# Patient Record
Sex: Female | Born: 1995
Health system: Southern US, Community
[De-identification: ages and names within clinical notes are randomized; demographics above are authoritative.]

## PROBLEM LIST (undated history)

## (undated) DIAGNOSIS — E162 Hypoglycemia, unspecified: Secondary | ICD-10-CM

---
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
## 2016-05-26 ENCOUNTER — Emergency Department (HOSPITAL_BASED_OUTPATIENT_CLINIC_OR_DEPARTMENT_OTHER)
Admission: EM | Admit: 2016-05-26 | Discharge: 2016-05-26 | Disposition: A | Discharge status: Home / Self Care | Payer: BLUE CROSS/BLUE SHIELD | Attending: Emergency Medicine | Admitting: Emergency Medicine

## 2016-05-26 ENCOUNTER — Encounter (HOSPITAL_BASED_OUTPATIENT_CLINIC_OR_DEPARTMENT_OTHER): Payer: Self-pay | Admitting: Emergency Medicine

## 2016-05-26 DIAGNOSIS — G43009 Migraine without aura, not intractable, without status migrainosus: Secondary | ICD-10-CM | POA: Diagnosis not present

## 2016-05-26 DIAGNOSIS — G43909 Migraine, unspecified, not intractable, without status migrainosus: Secondary | ICD-10-CM

## 2016-05-26 DIAGNOSIS — R51 Headache: Secondary | ICD-10-CM | POA: Diagnosis present

## 2016-05-26 MED ORDER — SODIUM CHLORIDE 0.9 % IV SOLN
INTRAVENOUS | Status: DC
  Administered 2016-05-26: 100 mL/h via INTRAVENOUS

## 2016-05-26 MED ORDER — DIPHENHYDRAMINE HCL 50 MG/ML IJ SOLN
25.0000 mg | Freq: Once | INTRAMUSCULAR | Status: AC
  Administered 2016-05-26: 25 mg via INTRAVENOUS
  Filled 2016-05-26: qty 1

## 2016-05-26 MED ORDER — PROMETHAZINE HCL 25 MG/ML IJ SOLN
12.5000 mg | Freq: Once | INTRAMUSCULAR | Status: AC
  Administered 2016-05-26: 12.5 mg via INTRAVENOUS
  Filled 2016-05-26: qty 1

## 2016-05-26 MED ORDER — DEXAMETHASONE SODIUM PHOSPHATE 10 MG/ML IJ SOLN
10.0000 mg | Freq: Once | INTRAMUSCULAR | Status: AC
  Administered 2016-05-26: 10 mg via INTRAVENOUS
  Filled 2016-05-26: qty 1

## 2016-05-26 MED ORDER — SODIUM CHLORIDE 0.9 % IV BOLUS (SEPSIS)
1000.0000 mL | Freq: Once | INTRAVENOUS | Status: AC
  Administered 2016-05-26: 1000 mL via INTRAVENOUS

## 2016-05-26 NOTE — ED Provider Notes (Signed)
[facility] Provider Note   CSN: [id number] Arrival date & time: 05/26/16  2041  By signing my name below, I, Majel Homer, attest that this documentation has been prepared under the direction and in the presence of Vanetta Mulders, MD . Electronically Signed: Majel Homer, Scribe. 05/26/2016. 10:21 PM.  History   Chief Complaint Chief Complaint  Patient presents with  . Headache   The history is provided by the patient. No language interpreter was used.   HPI Comments: Brittany Mccoy is a 20 y.o. female who presents to the Emergency Department for an evaluation of a gradually improving, left sided headache. Pt reports her headache began at ~12:00 PM this afternoon and intensified to a 10/10 headache at ~8:00 PM this evening. She notes her headache has now improved to a 7/10 pain in the ED. Pt reports her pain begins in her left temple and radiates to her left posterior head. She notes associated photophobia, "starry vision" and lightheadedness that has now resolved. She reports hx of headaches but notes they are usually not centralized to the left side of her head and resolve with medication. She denies nausea, vomiting and fever.   Past Medical History:  Diagnosis Date  . Hypoglycemia    There are no active problems to display for this patient.  History reviewed. No pertinent surgical history.  OB History    No data available     Home Medications    Prior to Admission medications   Medication Sig Start Date End Date Taking? Authorizing Provider  acetaminophen (TYLENOL) 500 MG tablet Take 1,000 mg by mouth every 6 (six) hours as needed. For pain     Historical Provider, MD  dexmethylphenidate (FOCALIN) 10 MG tablet Take 10 mg by mouth daily as needed. For ADHD    Historical Provider, MD  ibuprofen (ADVIL,MOTRIN) 600 MG tablet Take 1 tablet (600 mg total) by mouth every 6 (six) hours as needed. 08/08/13   Glynn Octave, MD  naproxen sodium (ANAPROX) 220 MG tablet Take  440 mg by mouth 2 (two) times daily with a meal.    Historical Provider, MD  sulfamethoxazole-trimethoprim (SEPTRA DS) 800-160 MG per tablet Take 1 tablet by mouth every 12 (twelve) hours. 04/09/12   Elson Areas, PA-C    Family History History reviewed. No pertinent family history.  Social History Social History  Substance Use Topics  . Smoking status: Never Smoker  . Smokeless tobacco: Never Used  . Alcohol use No   Allergies   Penicillins  Review of Systems Review of Systems  Constitutional: Negative for fever.  HENT: Negative for rhinorrhea and sore throat.   Eyes: Positive for photophobia and visual disturbance.  Respiratory: Negative for cough and shortness of breath.   Cardiovascular: Negative for chest pain and leg swelling.  Gastrointestinal: Negative for abdominal pain, diarrhea, nausea and vomiting.  Genitourinary: Negative for dysuria.  Musculoskeletal: Negative for back pain and neck pain.  Skin: Negative for rash.  Neurological: Positive for light-headedness and headaches.  Hematological: Does not bruise/bleed easily.   Physical Exam Updated Vital Signs BP 111/59 (BP Location: Right Arm)   Pulse 70   Temp 98 F (36.7 C)   Resp 17   Ht  (1.727 m)   Wt 143 lb 3.2 oz (65 kg)   LMP 05/11/2016   SpO2 100%   BMI 21.77 kg/m   Physical Exam  Constitutional: She is oriented to person, place, and time.  HENT:  Head: Normocephalic and atraumatic.  Mouth/Throat: Oropharynx is clear and moist.  Moist mucous membranes   Eyes: EOM are normal. Pupils are equal, round, and reactive to light. No scleral icterus.  Eyes are tracking normally  Neck: Normal range of motion. Neck supple.  Neck supple with normal ROM.  Cardiovascular: Normal rate, regular rhythm and normal heart sounds.   Pulmonary/Chest: Effort normal and breath sounds normal.  Lungs clear to auscultation bilaterally   Abdominal: Bowel sounds are normal. There is no tenderness.    Musculoskeletal: She exhibits no edema, tenderness or deformity.  No swelling in ankles   Neurological: She is alert and oriented to person, place, and time. Coordination normal.  Skin: No rash noted. No erythema. No pallor.   ED Treatments / Results  Labs (all labs ordered are listed, but only abnormal results are displayed) Labs Reviewed - No data to display  EKG  EKG Interpretation None       Radiology No results found.  Procedures Procedures (including critical care time)  Medications Ordered in ED Medications  0.9 %  sodium chloride infusion (100 mL/hr Intravenous New Bag/Given 05/26/16 2239)  sodium chloride 0.9 % bolus 1,000 mL (1,000 mLs Intravenous New Bag/Given 05/26/16 2238)  dexamethasone (DECADRON) injection 10 mg (10 mg Intravenous Given 05/26/16 2244)  promethazine (PHENERGAN) injection 12.5 mg (12.5 mg Intravenous Given 05/26/16 2244)  diphenhydrAMINE (BENADRYL) injection 25 mg (25 mg Intravenous Given 05/26/16 2244)    DIAGNOSTIC STUDIES:  Oxygen Saturation is 100% on RA, normal by my interpretation.    COORDINATION OF CARE:  10:21 PM Discussed treatment plan with pt at bedside and pt agreed to plan.  Initial Impression / Assessment and Plan / ED Course  I have reviewed the triage vital signs and the nursing notes.  Pertinent labs & imaging results that were available during my care of the patient were reviewed by me and considered in my medical decision making (see chart for details).  Clinical Course    Headache very suggestive of migraine. Left-sided headache. Progressively worse throughout the evening peaked at about 8 PM. And slowly started to improve. Associated with some visual changes. No family history of migraines. Patient's had headaches before but not like this. Not suggestive of subarachnoid hemorrhage. Patient was significant improvement with migraine cocktail Decadron Phenergan and Benadryl. As well as 1 L of IV fluids. Patient was  significant improvement headache is like a 1 out of 10 now. Patient we discharged home follow-up with her primary care Dr. work note provided.  Patient nontoxic no fevers. No concerns for meningitis neck is very supple.   I personally performed the services described in this documentation, which was scribed in my presence. The recorded information has been reviewed and is accurate.   Final Clinical Impressions(s) / ED Diagnoses   Final diagnoses:  Migraine without status migrainosus, not intractable, unspecified migraine type    New Prescriptions New Prescriptions   No medications on file     Vanetta MuldersScott Valor [place], MD 05/26/16 2325

## 2016-05-26 NOTE — ED Triage Notes (Addendum)
Patient reports that she had a Headache starting at about 12 oclock. Patient denies any N/V - reports that she did have some dizziness. Patient reports that she has the worst headache ever. Reports that she became real dizzy and then started to have the Headache. Has a History of Headaches but never this bad or this long. Denies any Injury

## 2016-05-26 NOTE — Discharge Instructions (Signed)
Work note provided to stay at home tomorrow. Symptoms very suggestive of migraine headache. Would expect resolution of the headache throughout tomorrow. Return for any new or worse symptoms. Make an appointment to follow-up with your doctor over the next couple weeks for reevaluation.

## 2016-07-23 ENCOUNTER — Emergency Department (HOSPITAL_BASED_OUTPATIENT_CLINIC_OR_DEPARTMENT_OTHER)
Admission: EM | Admit: 2016-07-23 | Discharge: 2016-07-23 | Disposition: A | Discharge status: Home / Self Care | Payer: BLUE CROSS/BLUE SHIELD | Attending: Emergency Medicine | Admitting: Emergency Medicine

## 2016-07-23 ENCOUNTER — Encounter (HOSPITAL_BASED_OUTPATIENT_CLINIC_OR_DEPARTMENT_OTHER): Payer: Self-pay | Admitting: Emergency Medicine

## 2016-07-23 ENCOUNTER — Emergency Department (HOSPITAL_BASED_OUTPATIENT_CLINIC_OR_DEPARTMENT_OTHER): Payer: BLUE CROSS/BLUE SHIELD

## 2016-07-23 DIAGNOSIS — R0789 Other chest pain: Secondary | ICD-10-CM | POA: Insufficient documentation

## 2016-07-23 DIAGNOSIS — R1110 Vomiting, unspecified: Secondary | ICD-10-CM | POA: Insufficient documentation

## 2016-07-23 DIAGNOSIS — J3489 Other specified disorders of nose and nasal sinuses: Secondary | ICD-10-CM | POA: Diagnosis not present

## 2016-07-23 DIAGNOSIS — R05 Cough: Secondary | ICD-10-CM | POA: Diagnosis present

## 2016-07-23 DIAGNOSIS — Z79899 Other long term (current) drug therapy: Secondary | ICD-10-CM | POA: Diagnosis not present

## 2016-07-23 DIAGNOSIS — R0982 Postnasal drip: Secondary | ICD-10-CM

## 2016-07-23 DIAGNOSIS — R059 Cough, unspecified: Secondary | ICD-10-CM

## 2016-07-23 MED ORDER — ALBUTEROL SULFATE HFA 108 (90 BASE) MCG/ACT IN AERS: 2.0000 | INHALATION_SPRAY | Freq: Four times a day (QID) | RESPIRATORY_TRACT | 0 refills | Status: AC | PRN

## 2016-07-23 MED ORDER — FLUTICASONE PROPIONATE 50 MCG/ACT NA SUSP: 2.0000 | Freq: Every day | NASAL | 0 refills | Status: AC

## 2016-07-23 NOTE — ED Provider Notes (Signed)
[facility]   CSN: [id number] Arrival date & time: 07/23/16  1112     History   Chief Complaint Chief Complaint  Patient presents with  . Cough    HPI Brittany Mccoy is a 21 y.o. female with PMH of seasonal allergies who presents with cough for 2.5 weeks.   HPI Patient with cough for 2.5 week which has worsened over the past week. No fevers or sore throat. Reports of post nasal drip. Reports of chest pain with cough. Reports of post-tussive emesis for the past few days but has been able to tolerate PO intake. Reports of symptoms worsening at night and patient has difficulty breathing at night. No sick contacts. Reports of two episodes of loose stools yesterday. No weight loss, hemoptysis. Normal PO intake   Past Medical History:  Diagnosis Date  . Hypoglycemia     There are no active problems to display for this patient.   History reviewed. No pertinent surgical history.  OB History    No data available       Home Medications    Prior to Admission medications   Medication Sig Start Date End Date Taking? Authorizing Provider  acetaminophen (TYLENOL) 500 MG tablet Take 1,000 mg by mouth every 6 (six) hours as needed. For pain     Historical Provider, MD  albuterol (PROVENTIL HFA;VENTOLIN HFA) 108 (90 Base) MCG/ACT inhaler Inhale 2 puffs into the lungs every 6 (six) hours as needed for wheezing or shortness of breath. 07/23/16   Palma Holter, MD  dexmethylphenidate (FOCALIN) 10 MG tablet Take 10 mg by mouth daily as needed. For ADHD    Historical Provider, MD  fluticasone (FLONASE) 50 MCG/ACT nasal spray Place 2 sprays into both nostrils daily. 07/23/16   Palma Holter, MD  ibuprofen (ADVIL,MOTRIN) 600 MG tablet Take 1 tablet (600 mg total) by mouth every 6 (six) hours as needed. 08/08/13   Glynn Octave, MD  naproxen sodium (ANAPROX) 220 MG tablet Take 440 mg by mouth 2 (two) times daily with a meal.    Historical Provider, MD    sulfamethoxazole-trimethoprim (SEPTRA DS) 800-160 MG per tablet Take 1 tablet by mouth every 12 (twelve) hours. 04/09/12   Elson Areas, PA-C    Family History No family history on file.  Social History Social History  Substance Use Topics  . Smoking status: Never Smoker  . Smokeless tobacco: Never Used  . Alcohol use Not on file     Allergies   Penicillins   Review of Systems Review of Systems  Constitutional: Negative for appetite change, chills and fever.  HENT: Positive for postnasal drip and rhinorrhea. Negative for sneezing and sore throat.   Respiratory: Positive for cough and chest tightness. Negative for shortness of breath.   Gastrointestinal: Negative for abdominal pain.     Physical Exam Updated Vital Signs BP 109/61 (BP Location: Right Arm)   Pulse 68   Temp 98.7 F (37.1 C) (Oral)   Resp 18   Ht 5\' 8"  (1.727 m)   Wt 61.2 kg   LMP 06/24/2016 Comment: BC patch  SpO2 100%   BMI 20.53 kg/m   Physical Exam  Constitutional: She is oriented to person, place, and time. She appears well-developed and well-nourished. No distress.  HENT:  Mouth/Throat: Oropharynx is clear and moist. No oropharyngeal exudate.  Eyes: Conjunctivae are normal.  Neck: Normal range of motion. Neck supple.  Cardiovascular: Normal rate, regular rhythm and normal heart sounds.  Exam  reveals no gallop and no friction rub.   No murmur heard. Pulmonary/Chest: Effort normal and breath sounds normal. No respiratory distress. She has no wheezes. She has no rales. She exhibits no tenderness.  Abdominal: Soft. Bowel sounds are normal. There is no tenderness.  Lymphadenopathy:    She has no cervical adenopathy.  Neurological: She is alert and oriented to person, place, and time.  Skin: Skin is warm and dry. Capillary refill takes less than 2 seconds.  Psychiatric: She has a normal mood and affect.     ED Treatments / Results  Labs (all labs ordered are listed, but only abnormal  results are displayed) Labs Reviewed - No data to display  EKG  EKG Interpretation None       Radiology Dg Chest 2 View  Result Date: 07/23/2016 CLINICAL DATA:  21 year-old female w/ productive cough since 07/05/16 EXAM: CHEST  2 VIEW COMPARISON:  None. FINDINGS: Midline trachea.  Normal heart size and mediastinal contours. Sharp costophrenic angles.  No pneumothorax.  Clear lungs. IMPRESSION: No active cardiopulmonary disease. Electronically Signed   By: Jeronimo GreavesKyle  Talbot M.D.   On: 07/23/2016 12:32    Procedures Procedures (including critical care time)  Medications Ordered in ED Medications - No data to display   Initial Impression / Assessment and Plan / ED Course  I have reviewed the triage vital signs and the nursing notes.  Pertinent labs & imaging results that were available during my care of the patient were reviewed by me and considered in my medical decision making (see chart for details).  Clinical Course    21yo female with PMH of seasonal allergies with cough for 2.5 weeks. Afebrile with stable vitals and normal oxygen saturation. CXR is unremarkable. Symptoms likely due to post nasal drip. Considered pertussis as well with post-tussive emesis but due to the duration of symptoms, medication would not be indicated for this. Provided Flonase and Albuterol PRN. Return precautions discussed.   Final Clinical Impressions(s) / ED Diagnoses   Final diagnoses:  Cough  Postnasal drip    New Prescriptions Discharge Medication List as of 07/23/2016  1:18 PM    START taking these medications   Details  albuterol (PROVENTIL HFA;VENTOLIN HFA) 108 (90 Base) MCG/ACT inhaler Inhale 2 puffs into the lungs every 6 (six) hours as needed for wheezing or shortness of breath., Starting Sat 07/23/2016, Print    fluticasone (FLONASE) 50 MCG/ACT nasal spray Place 2 sprays into both nostrils daily., Starting Sat 07/23/2016, Print         Palma HolterKanishka G Graiden Henes, MD 07/23/16 1504      Charlynne Panderavid Hsienta Yao, MD 07/23/16 [phone]

## 2016-07-23 NOTE — ED Triage Notes (Signed)
Has had a cough since 07/05/16, persistent cough, nasal drainage, coughing yellow mucus. Took dayquil PTA

## 2016-07-23 NOTE — ED Notes (Signed)
Pt's mother sts she is not "satisfied" because "I know she has bronchitis." RN offered to get the EDP so she could discuss concerns twice, but mother refused both times. Sts "I'll just take her to her doctor next week."

## 2016-07-23 NOTE — Discharge Instructions (Signed)
Your chest x-ray was normal. Your cough is most likely due to postnasal drip. Please take Flonase as prescribed. You can also try Albuterol as needed.

## 2018-07-29 IMAGING — DX DG CHEST 2V
2 series · 2 of 2 positions shown · non-contrast
Comparison: None.

CLINICAL DATA: 20 year-old female w/ productive cough since
07/05/16

EXAM:
CHEST  2 VIEW

[chest pa]
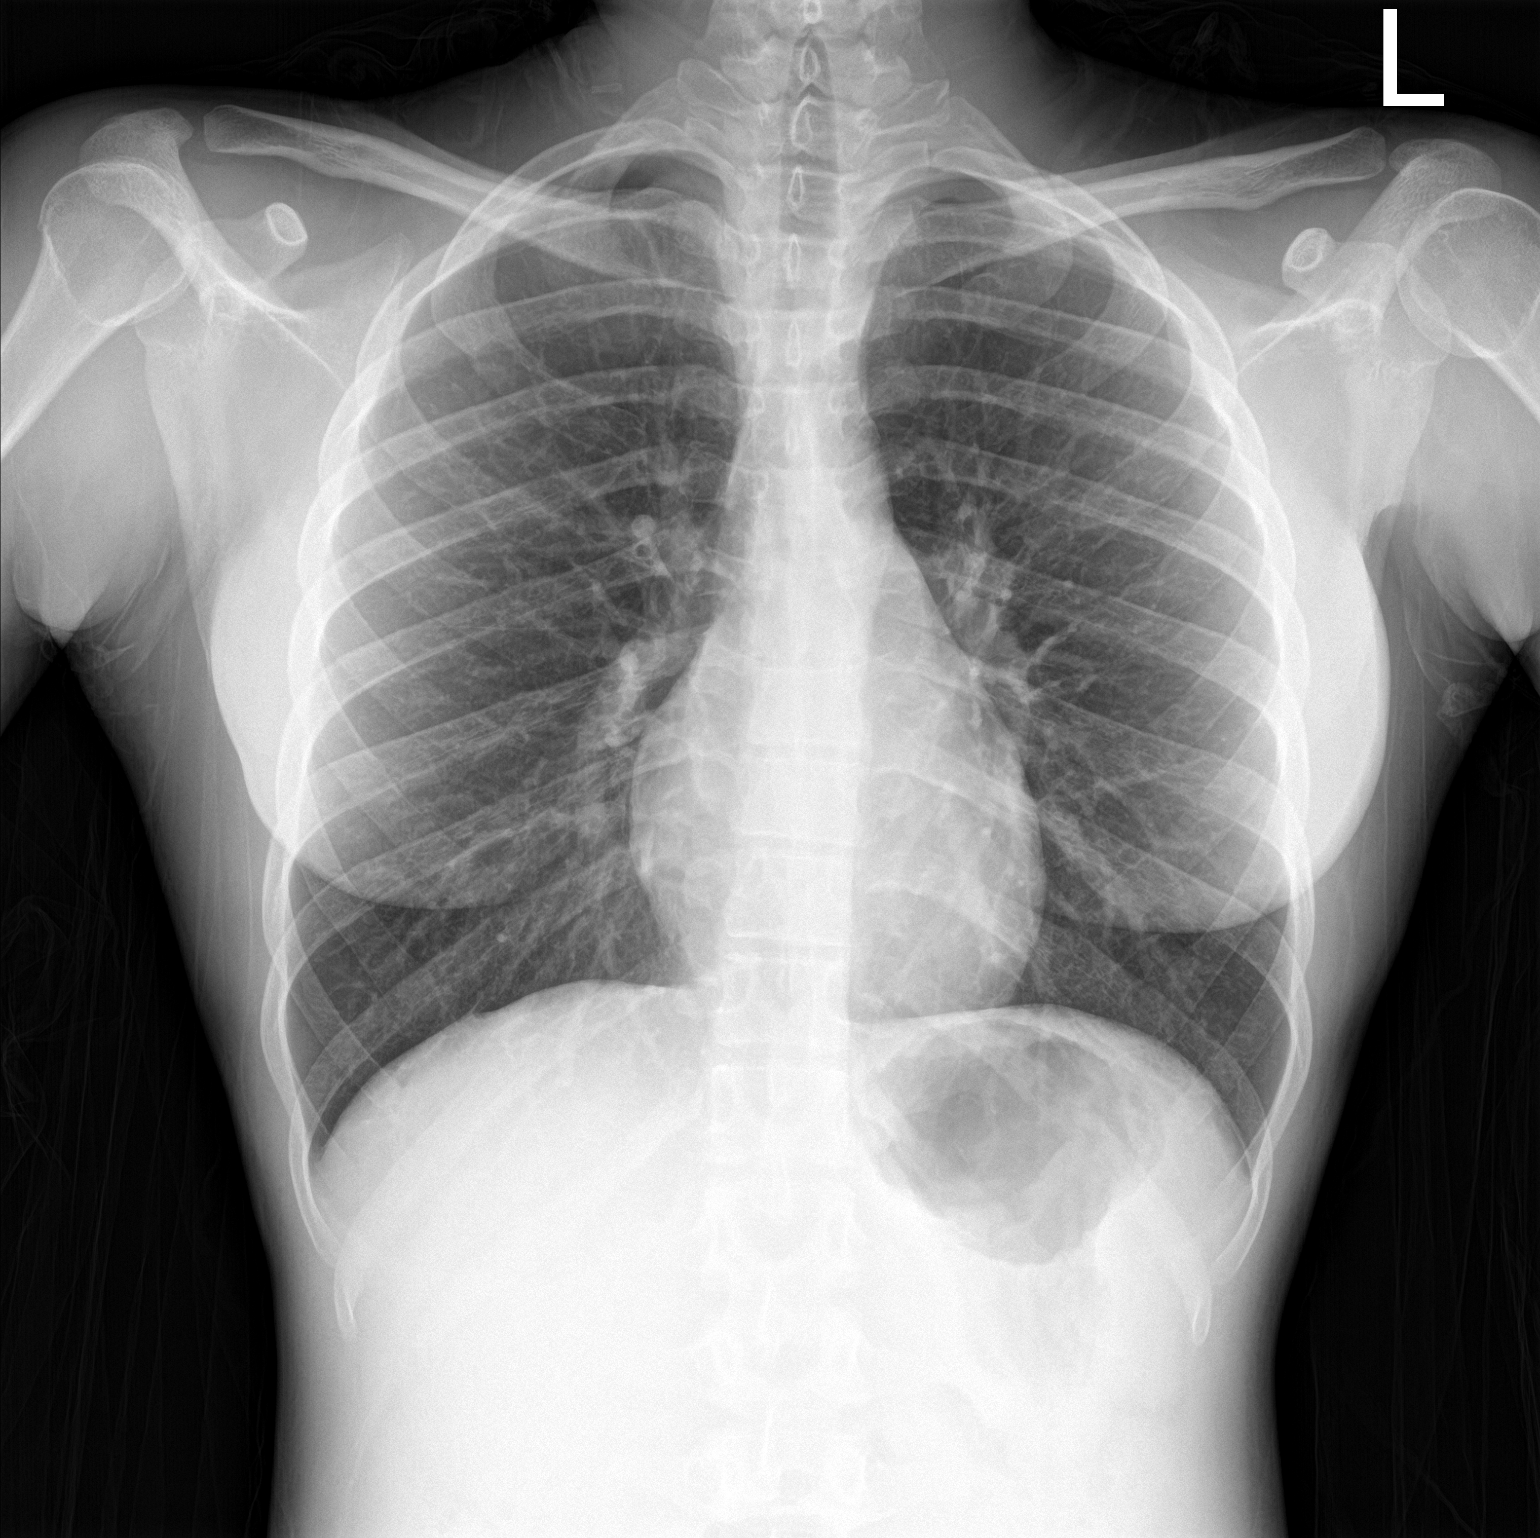

[chest lat]
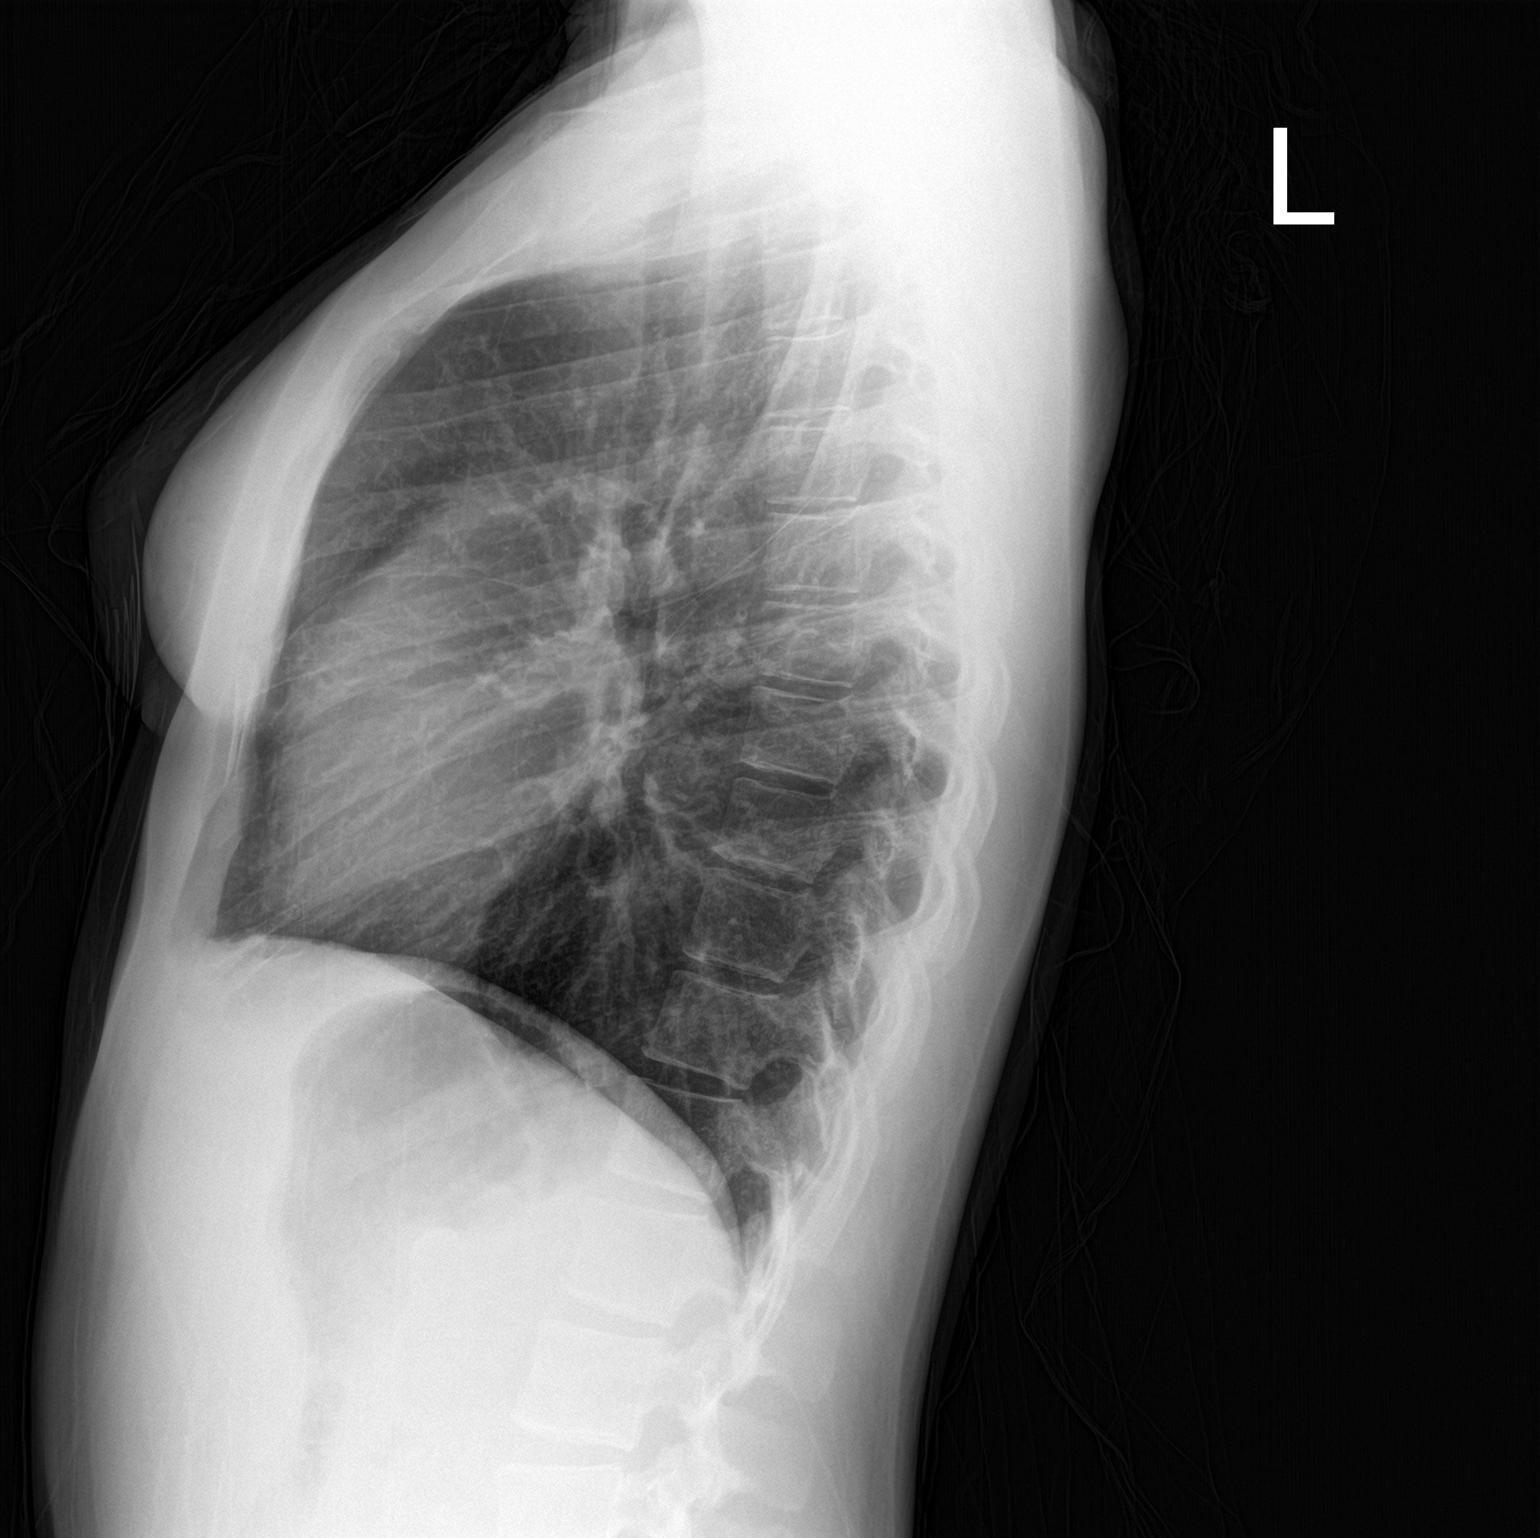

[2 of 2 positions shown; findings below may reference images not displayed]

FINDINGS: Midline trachea.  Normal heart size and mediastinal contours.

Sharp costophrenic angles.  No pneumothorax.  Clear lungs.
IMPRESSION: No active cardiopulmonary disease.

## 2019-03-08 ENCOUNTER — Other Ambulatory Visit: Payer: Self-pay

## 2019-03-08 DIAGNOSIS — Z20822 Contact with and (suspected) exposure to covid-19: Secondary | ICD-10-CM

## 2019-03-10 LAB — NOVEL CORONAVIRUS, NAA: SARS-CoV-2, NAA: NOT DETECTED

## 2022-07-13 ENCOUNTER — Encounter (HOSPITAL_BASED_OUTPATIENT_CLINIC_OR_DEPARTMENT_OTHER): Payer: Self-pay | Admitting: Emergency Medicine

## 2022-07-13 ENCOUNTER — Other Ambulatory Visit: Payer: Self-pay

## 2022-07-13 ENCOUNTER — Emergency Department (HOSPITAL_BASED_OUTPATIENT_CLINIC_OR_DEPARTMENT_OTHER): Payer: BC Managed Care – PPO

## 2022-07-13 ENCOUNTER — Emergency Department (HOSPITAL_BASED_OUTPATIENT_CLINIC_OR_DEPARTMENT_OTHER)
Admission: EM | Admit: 2022-07-13 | Discharge: 2022-07-13 | Disposition: A | Discharge status: Home / Self Care | Payer: BC Managed Care – PPO | Attending: Emergency Medicine | Admitting: Emergency Medicine

## 2022-07-13 DIAGNOSIS — M79605 Pain in left leg: Secondary | ICD-10-CM | POA: Diagnosis not present

## 2022-07-13 DIAGNOSIS — E876 Hypokalemia: Secondary | ICD-10-CM | POA: Insufficient documentation

## 2022-07-13 DIAGNOSIS — R079 Chest pain, unspecified: Secondary | ICD-10-CM

## 2022-07-13 DIAGNOSIS — M79604 Pain in right leg: Secondary | ICD-10-CM

## 2022-07-13 DIAGNOSIS — R0789 Other chest pain: Secondary | ICD-10-CM | POA: Diagnosis present

## 2022-07-13 LAB — COMPREHENSIVE METABOLIC PANEL
ALT: 12 U/L (ref 0–44)
AST: 22 U/L (ref 15–41)
Albumin: 3.9 g/dL (ref 3.5–5.0)
Alkaline Phosphatase: 32 U/L — ABNORMAL LOW (ref 38–126)
Anion gap: 11 (ref 5–15)
BUN: 8 mg/dL (ref 6–20)
CO2: 22 mmol/L (ref 22–32)
Calcium: 8.9 mg/dL (ref 8.9–10.3)
Chloride: 105 mmol/L (ref 98–111)
Creatinine, Ser: 0.79 mg/dL (ref 0.44–1.00)
GFR, Estimated: >60 mL/min (ref >60)
Glucose, Bld: 101 mg/dL — ABNORMAL HIGH (ref 70–99)
Potassium: 3 mmol/L — ABNORMAL LOW (ref 3.5–5.1)
Sodium: 138 mmol/L (ref 135–145)
Total Bilirubin: 1.4 mg/dL — ABNORMAL HIGH (ref 0.3–1.2)
Total Protein: 7.3 g/dL (ref 6.5–8.1)

## 2022-07-13 LAB — URINALYSIS, MICROSCOPIC (REFLEX)

## 2022-07-13 LAB — CBC WITH DIFFERENTIAL/PLATELET
Abs Immature Granulocytes: 0.02 10*3/uL (ref 0.00–0.07)
Basophils Absolute: 0 10*3/uL (ref 0.0–0.1)
Basophils Relative: 0 %
Eosinophils Absolute: 0 10*3/uL (ref 0.0–0.5)
Eosinophils Relative: 0 %
HCT: 34.6 % — ABNORMAL LOW (ref 36.0–46.0)
Hemoglobin: 11.5 g/dL — ABNORMAL LOW (ref 12.0–15.0)
Immature Granulocytes: 0 %
Lymphocytes Relative: 11 %
Lymphs Abs: 0.7 10*3/uL (ref 0.7–4.0)
MCH: 26 pg (ref 26.0–34.0)
MCHC: 33.2 g/dL (ref 30.0–36.0)
MCV: 78.3 fL — ABNORMAL LOW (ref 80.0–100.0)
Monocytes Absolute: 0.3 10*3/uL (ref 0.1–1.0)
Monocytes Relative: 5 %
Neutro Abs: 4.9 10*3/uL (ref 1.7–7.7)
Neutrophils Relative %: 84 %
Platelets: 262 10*3/uL (ref 150–400)
RBC: 4.42 MIL/uL (ref 3.87–5.11)
RDW: 13.2 % (ref 11.5–15.5)
WBC: 5.8 10*3/uL (ref 4.0–10.5)
nRBC: 0 % (ref 0.0–0.2)

## 2022-07-13 LAB — URINALYSIS, ROUTINE W REFLEX MICROSCOPIC
Bilirubin Urine: NEGATIVE
Glucose, UA: NEGATIVE mg/dL
Ketones, ur: 40 mg/dL — AB
Nitrite: POSITIVE — AB
Protein, ur: NEGATIVE mg/dL
Specific Gravity, Urine: <=1.005 (ref 1.005–1.030)
pH: 5.5 (ref 5.0–8.0)

## 2022-07-13 LAB — HCG, QUANTITATIVE, PREGNANCY: hCG, Beta Chain, Quant, S: <1 m[IU]/mL (ref <5)

## 2022-07-13 MED ORDER — POTASSIUM CHLORIDE CRYS ER 20 MEQ PO TBCR
40.0000 meq | EXTENDED_RELEASE_TABLET | Freq: Once | ORAL | Status: AC
  Administered 2022-07-13: 40 meq via ORAL
  Filled 2022-07-13: qty 2

## 2022-07-13 MED ORDER — KETOROLAC TROMETHAMINE 15 MG/ML IJ SOLN
15.0000 mg | Freq: Once | INTRAMUSCULAR | Status: AC
  Administered 2022-07-13: 15 mg via INTRAMUSCULAR
  Filled 2022-07-13: qty 1

## 2022-07-13 NOTE — ED Triage Notes (Addendum)
Pt sts BLE started hurting when she was walking into work, then her whole body started shaking; c/o CP at that time that has continued; pt started crying; pt was able to drive here and feels better now

## 2022-07-13 NOTE — ED Provider Notes (Signed)
[name] EMERGENCY DEPARTMENT Provider Note   CSN: [id number] Arrival date & time: 07/13/22  1150     History  Chief Complaint  Patient presents with   Leg Pain    Brittany Mccoy is a 27 y.o. female.  27 yo F with a chief complaints of bilateral leg achiness.  She tells me that she was in her car waiting to pension at work and suddenly felt like both of her legs were achy.  This has been going on throughout the day today.  She also started complaining of some right-sided chest discomfort.  This is pinpoint nothing seems to make it better or worse.  That is been going on since last week.  She will get more persistent today.  Feels like a squeezing across the center of her chest.  She denies cough congestion or fever denies nausea vomiting or diarrhea denies trauma.  Denies new exercise routine.  Denies change in diet.   Leg Pain      Home Medications Prior to Admission medications   Medication Sig Start Date End Date Taking? Authorizing Provider  acetaminophen (TYLENOL) 500 MG tablet Take 1,000 mg by mouth every 6 (six) hours as needed. For pain    Yes [provider]  albuterol (PROVENTIL HFA;VENTOLIN HFA) 108 (90 Base) MCG/ACT inhaler Inhale 2 puffs into the lungs every 6 (six) hours as needed for wheezing or shortness of breath. 07/23/16   Smiley Houseman, MD  dexmethylphenidate (FOCALIN) 10 MG tablet Take 10 mg by mouth daily as needed. For ADHD    [provider]  fluticasone (FLONASE) 50 MCG/ACT nasal spray Place 2 sprays into both nostrils daily. 07/23/16   Smiley Houseman, MD  ibuprofen (ADVIL,MOTRIN) 600 MG tablet Take 1 tablet (600 mg total) by mouth every 6 (six) hours as needed. 08/08/13   Rancour, Annie Main, MD  naproxen sodium (ANAPROX) 220 MG tablet Take 440 mg by mouth 2 (two) times daily with a meal.    [provider]  sulfamethoxazole-trimethoprim (SEPTRA DS) 800-160 MG per tablet Take 1 tablet by mouth every 12 (twelve)  hours. 04/09/12   Fransico Meadow, PA-C      Allergies    Penicillins    Review of Systems   Review of Systems  Physical Exam Updated Vital Signs BP 111/82 (BP Location: Right Arm)   Pulse 86   Temp 99 F (37.2 C)   Resp 14   Ht 5\' 7"  (1.702 m)   Wt 65.8 kg   LMP 07/04/2022   SpO2 100%   BMI 22.71 kg/m  Physical Exam Vitals and nursing note reviewed.  Constitutional:      General: She is not in acute distress.    Appearance: She is well-developed. She is not diaphoretic.  HENT:     Head: Normocephalic and atraumatic.  Eyes:     Pupils: Pupils are equal, round, and reactive to light.  Cardiovascular:     Rate and Rhythm: Normal rate and regular rhythm.     Heart sounds: No murmur heard.    No friction rub. No gallop.     Comments: Palpation of the right anterior chest wall about the midclavicular line about ribs 2 through 3 reproduce her discomfort. Pulmonary:     Effort: Pulmonary effort is normal.     Breath sounds: No wheezing or rales.  Abdominal:     General: There is no distension.     Palpations: Abdomen is soft.  Tenderness: There is no abdominal tenderness.  Musculoskeletal:        General: No tenderness.     Cervical back: Normal range of motion and neck supple.  Skin:    General: Skin is warm and dry.  Neurological:     Mental Status: She is alert and oriented to person, place, and time.  Psychiatric:        Behavior: Behavior normal.     ED Results / Procedures / Treatments   Labs (all labs ordered are listed, but only abnormal results are displayed) Labs Reviewed  URINALYSIS, ROUTINE W REFLEX MICROSCOPIC - Abnormal; Notable for the following components:      Result Value   Hgb urine dipstick MODERATE (*)    Ketones, ur 40 (*)    Nitrite POSITIVE (*)    Leukocytes,Ua SMALL (*)    All other components within normal limits  COMPREHENSIVE METABOLIC PANEL - Abnormal; Notable for the following components:   Potassium 3.0 (*)    Glucose, Bld  101 (*)    Alkaline Phosphatase 32 (*)    Total Bilirubin 1.4 (*)    All other components within normal limits  CBC WITH DIFFERENTIAL/PLATELET - Abnormal; Notable for the following components:   Hemoglobin 11.5 (*)    HCT 34.6 (*)    MCV 78.3 (*)    All other components within normal limits  URINALYSIS, MICROSCOPIC (REFLEX) - Abnormal; Notable for the following components:   Bacteria, UA RARE (*)    All other components within normal limits  HCG, QUANTITATIVE, PREGNANCY  CBC WITH DIFFERENTIAL/PLATELET    EKG EKG Interpretation  Date/Time:  Wednesday July 13 2022 12:35:20 EST Ventricular Rate:  109 PR Interval:  143 QRS Duration: 79 QT Interval:  328 QTC Calculation: 442 R Axis:   86 Text Interpretation: Sinus tachycardia Borderline T abnormalities, diffuse leads Confirmed by Alona Bene [phone]) on 07/13/2022 1:26:55 PM  Radiology DG Chest Port 1 View  Result Date: 07/13/2022 CLINICAL DATA:  Chest pain EXAM: PORTABLE CHEST 1 VIEW COMPARISON:  07/23/2016 FINDINGS: Cardiac and mediastinal contours are within normal limits. No focal pulmonary opacity. No pleural effusion or pneumothorax. No acute osseous abnormality. IMPRESSION: No acute cardiopulmonary process. Electronically Signed   By: Wiliam Ke M.D.   On: 07/13/2022 15:44    Procedures Procedures    Medications Ordered in ED Medications  potassium chloride SA (KLOR-CON M) CR tablet 40 mEq (40 mEq Oral Given 07/13/22 1539)  ketorolac (TORADOL) 15 MG/ML injection 15 mg (15 mg Intramuscular Given 07/13/22 1539)    ED Course/ Medical Decision Making/ A&P                           Medical Decision Making Amount and/or Complexity of Data Reviewed Labs: ordered. Radiology: ordered.  Risk Prescription drug management.   27 yo F with a chief complaints of bilateral leg discomfort.  This started reportedly when she was sitting in her car waiting for work to start.  Sounds like she was unable to work and then came here  afterwards.  She has been complaining of chest pain for about a week as well.  This is atypical in nature and reproduced on exam.  Most likely musculoskeletal.  Lab work here without obvious concerning pathology.  She does have mild hypokalemia, with her having diffuse symptoms I wonder if there is some familial component.  Encouraged her to follow-up with her PCP in the office.  Chest x-ray  independently interpreted by me without focal infiltrate or pneumothorax.  Patient's symptoms are completely atypical of pulmonary embolism and I feel no further workup is needed.  3:56 PM:  I have discussed the diagnosis/risks/treatment options with the patient.  Evaluation and diagnostic testing in the emergency department does not suggest an emergent condition requiring admission or immediate intervention beyond what has been performed at this time.  They will follow up with PCP. We also discussed returning to the ED immediately if new or worsening sx occur. We discussed the sx which are most concerning (e.g., sudden worsening pain, fever, inability to tolerate by mouth) that necessitate immediate return. Medications administered to the patient during their visit and any new prescriptions provided to the patient are listed below.  Medications given during this visit Medications  potassium chloride SA (KLOR-CON M) CR tablet 40 mEq (40 mEq Oral Given 07/13/22 1539)  ketorolac (TORADOL) 15 MG/ML injection 15 mg (15 mg Intramuscular Given 07/13/22 1539)     The patient appears reasonably screen and/or stabilized for discharge and I doubt any other medical condition or other [facility] requiring further screening, evaluation, or treatment in the ED at this time prior to discharge.          Final Clinical Impression(s) / ED Diagnoses Final diagnoses:  Bilateral leg pain  Nonspecific chest pain    Rx / DC Orders ED Discharge Orders     None         Deno Etienne, DO 07/13/22 1556

## 2022-07-13 NOTE — Discharge Instructions (Signed)
Take 4 over the counter ibuprofen tablets 3 times a day or 2 over-the-counter naproxen tablets twice a day for pain. Also take tylenol 1000mg (2 extra strength) four times a day.   Please return for worsening symptoms if you develop a fever.  Please follow-up with your family doctor.  Your potassium was low today I am not sure what role that has to play in your symptoms.  You could try and increase your dietary potassium at home if you like.

## 2022-08-18 ENCOUNTER — Emergency Department (HOSPITAL_BASED_OUTPATIENT_CLINIC_OR_DEPARTMENT_OTHER): Payer: BC Managed Care – PPO

## 2022-08-18 ENCOUNTER — Emergency Department (HOSPITAL_BASED_OUTPATIENT_CLINIC_OR_DEPARTMENT_OTHER)
Admission: EM | Admit: 2022-08-18 | Discharge: 2022-08-18 | Disposition: A | Discharge status: Home / Self Care | Payer: BC Managed Care – PPO | Attending: Emergency Medicine | Admitting: Emergency Medicine

## 2022-08-18 ENCOUNTER — Other Ambulatory Visit: Payer: Self-pay

## 2022-08-18 ENCOUNTER — Encounter (HOSPITAL_BASED_OUTPATIENT_CLINIC_OR_DEPARTMENT_OTHER): Payer: Self-pay

## 2022-08-18 DIAGNOSIS — R0789 Other chest pain: Secondary | ICD-10-CM | POA: Diagnosis not present

## 2022-08-18 DIAGNOSIS — R079 Chest pain, unspecified: Secondary | ICD-10-CM | POA: Diagnosis present

## 2022-08-18 LAB — CBC
HCT: 31.6 % — ABNORMAL LOW (ref 36.0–46.0)
Hemoglobin: 10.5 g/dL — ABNORMAL LOW (ref 12.0–15.0)
MCH: 25.9 pg — ABNORMAL LOW (ref 26.0–34.0)
MCHC: 33.2 g/dL (ref 30.0–36.0)
MCV: 78 fL — ABNORMAL LOW (ref 80.0–100.0)
Platelets: 243 10*3/uL (ref 150–400)
RBC: 4.05 MIL/uL (ref 3.87–5.11)
RDW: 13.4 % (ref 11.5–15.5)
WBC: 4.2 10*3/uL (ref 4.0–10.5)
nRBC: 0 % (ref 0.0–0.2)

## 2022-08-18 LAB — BASIC METABOLIC PANEL
Anion gap: 6 (ref 5–15)
BUN: 10 mg/dL (ref 6–20)
CO2: 22 mmol/L (ref 22–32)
Calcium: 8.4 mg/dL — ABNORMAL LOW (ref 8.9–10.3)
Chloride: 108 mmol/L (ref 98–111)
Creatinine, Ser: 1 mg/dL (ref 0.44–1.00)
GFR, Estimated: >60 mL/min (ref >60)
Glucose, Bld: 112 mg/dL — ABNORMAL HIGH (ref 70–99)
Potassium: 3.5 mmol/L (ref 3.5–5.1)
Sodium: 136 mmol/L (ref 135–145)

## 2022-08-18 LAB — TROPONIN I (HIGH SENSITIVITY): Troponin I (High Sensitivity): <2 ng/L (ref <18)

## 2022-08-18 LAB — PREGNANCY, URINE: Preg Test, Ur: NEGATIVE

## 2022-08-18 MED ORDER — ACETAMINOPHEN 500 MG PO TABS
1000.0000 mg | ORAL_TABLET | Freq: Once | ORAL | Status: AC
  Administered 2022-08-18: 1000 mg via ORAL
  Filled 2022-08-18: qty 2

## 2022-08-18 NOTE — ED Provider Notes (Signed)
EMERGENCY DEPARTMENT AT [facility] Provider Note   CSN: [id number] Arrival date & time: 08/18/22  2039     History  Chief Complaint  Patient presents with   Chest Pain    Brittany Mccoy is a 27 y.o. female.  The history is provided by the patient and medical records. No language interpreter was used.  Chest Pain    27 year old female presenting complaining of chest pain.  Patient reports she had 4 wisdom teeth removed yesterday.  She was also placed on antibiotic including clindamycin which she took yesterday.  Today while watching TV she developed discomfort in her chest.  Pain is to her mid chest, sharp, achy, lasting less than an hour but resolved.  She is currently chest pain-free.  She did not report any associate lightheadedness, dizziness, nausea, diaphoresis, or shortness of breath with this episode.  She did have 1 similar episode like this a month ago was seen in the ED and states that it was normal.  She denies alcohol or tobacco use.  No significant cardiac history or family history of cardiac disease.  No history of PE.  She is without any other complaint.  Patient also reports no cold symptoms such as runny nose sneezing or coughing or sore throat.  Home Medications Prior to Admission medications   Medication Sig Start Date End Date Taking? Authorizing Provider  acetaminophen (TYLENOL) 500 MG tablet Take 1,000 mg by mouth every 6 (six) hours as needed. For pain     [provider]  albuterol (PROVENTIL HFA;VENTOLIN HFA) 108 (90 Base) MCG/ACT inhaler Inhale 2 puffs into the lungs every 6 (six) hours as needed for wheezing or shortness of breath. 07/23/16   Smiley Houseman, MD  dexmethylphenidate (FOCALIN) 10 MG tablet Take 10 mg by mouth daily as needed. For ADHD    [provider]  fluticasone (FLONASE) 50 MCG/ACT nasal spray Place 2 sprays into both nostrils daily. 07/23/16   Smiley Houseman, MD  ibuprofen (ADVIL,MOTRIN) 600  MG tablet Take 1 tablet (600 mg total) by mouth every 6 (six) hours as needed. 08/08/13   Rancour, Annie Main, MD  naproxen sodium (ANAPROX) 220 MG tablet Take 440 mg by mouth 2 (two) times daily with a meal.    [provider]  sulfamethoxazole-trimethoprim (SEPTRA DS) 800-160 MG per tablet Take 1 tablet by mouth every 12 (twelve) hours. 04/09/12   Fransico Meadow, PA-C      Allergies    Penicillins    Review of Systems   Review of Systems  Cardiovascular:  Positive for chest pain.  All other systems reviewed and are negative.   Physical Exam Updated Vital Signs BP 121/69   Pulse 85   Temp 100.1 F (37.8 C) (Oral)   Resp 17   Ht 5' 7"$  (1.702 m)   Wt 65.8 kg   LMP 08/10/2022 (Approximate)   SpO2 100%   BMI 22.71 kg/m  Physical Exam Vitals and nursing note reviewed.  Constitutional:      General: She is not in acute distress.    Appearance: She is well-developed.  HENT:     Head: Atraumatic.     Mouth/Throat:     Mouth: Mucous membranes are moist.     Comments: mild facial swelling noted from recent dental extraction but no obvious abscess appreciated. Eyes:     Conjunctiva/sclera: Conjunctivae normal.  Cardiovascular:     Rate and Rhythm: Normal rate and regular rhythm.  Pulses: Normal pulses.     Heart sounds: Normal heart sounds.  Pulmonary:     Effort: Pulmonary effort is normal.     Breath sounds: No wheezing, rhonchi or rales.  Abdominal:     Palpations: Abdomen is soft.     Tenderness: There is no abdominal tenderness.  Musculoskeletal:     Cervical back: Neck supple.  Skin:    Findings: No rash.  Neurological:     Mental Status: She is alert.  Psychiatric:        Mood and Affect: Mood normal.     ED Results / Procedures / Treatments   Labs (all labs ordered are listed, but only abnormal results are displayed) Labs Reviewed  BASIC METABOLIC PANEL - Abnormal; Notable for the following components:      Result Value   Glucose, Bld 112 (*)     Calcium 8.4 (*)    All other components within normal limits  CBC - Abnormal; Notable for the following components:   Hemoglobin 10.5 (*)    HCT 31.6 (*)    MCV 78.0 (*)    MCH 25.9 (*)    All other components within normal limits  PREGNANCY, URINE  TROPONIN I (HIGH SENSITIVITY)  TROPONIN I (HIGH SENSITIVITY)    EKG None  Date: 08/18/2022  Rate: 112  Rhythm: sinus tachycardia  QRS Axis: normal  Intervals: normal  ST/T Wave abnormalities: normal  Conduction Disutrbances: none  Narrative Interpretation:   Old EKG Reviewed: No significant changes noted    Radiology DG Chest 2 View  Result Date: 08/18/2022 CLINICAL DATA:  cp EXAM: CHEST - 2 VIEW COMPARISON:  Chest XR, 07/13/2022 FINDINGS: Cardiomediastinal silhouette is within normal limits. Lungs are well inflated. No focal consolidation or mass. No pleural effusion or pneumothorax. No acute displaced fracture. IMPRESSION: Normal chest. Electronically Signed   By: Michaelle Birks M.D.   On: 08/18/2022 21:24    Procedures Procedures    Medications Ordered in ED Medications - No data to display  ED Course/ Medical Decision Making/ A&P                             Medical Decision Making Amount and/or Complexity of Data Reviewed Labs: ordered. Radiology: ordered.   BP 121/69   Pulse 85   Temp 100.1 F (37.8 C) (Oral)   Resp 17   Ht 5' 7"$  (1.702 m)   Wt 65.8 kg   LMP 08/10/2022 (Approximate)   SpO2 100%   BMI 22.71 kg/m   87:9 PM 27 year old female presenting complaining of chest pain.  Patient reports she had 4 wisdom teeth removed yesterday.  She was also placed on antibiotic including clindamycin which she took yesterday.  Today while watching TV she developed discomfort in her chest.  Pain is to her mid chest, sharp, achy, lasting less than an hour but resolved.  She is currently chest pain-free.  She did not report any associate lightheadedness, dizziness, nausea, diaphoresis, or shortness of breath with  this episode.  She did have 1 similar episode like this a month ago was seen in the ED and states that it was normal.  She denies alcohol or tobacco use.  No significant cardiac history or family history of cardiac disease.  No history of PE.  She is without any other complaint.  Patient also reports no cold symptoms such as runny nose sneezing or coughing or sore throat.  On exam, patient  is sitting in the bed appears to be in no acute discomfort.  She does have some mild facial swelling from recent dental extraction but no obvious abscess or facial cellulitis appreciated.  Heart with normal rate and rhythm, lungs are clear to auscultation skin is little bit warm to the touch abdomen soft nontender no chest wall tenderness.  Vital sign review and overall reassuring.  Mild elevated temperature of 100.1.   -Labs ordered, independently viewed and interpreted by me.  Labs remarkable for negative preg test, normal WBC, Hgb 10.5, normal electrolytes panel, normal trop -The patient was maintained on a cardiac monitor.  I personally viewed and interpreted the cardiac monitored which showed an underlying rhythm of: sinus tachycardia -Imaging independently viewed and interpreted by me and I agree with radiologist's interpretation.  Result remarkable for CXR without acute changes -This patient presents to the ED for concern of chest pain, this involves an extensive number of treatment options, and is a complaint that carries with it a high risk of complications and morbidity.  The differential diagnosis includes flu, covid, rsv, gerd, gastritis, PE, acs, dissection, chest wall pain -Co morbidities that complicate the patient evaluation includes none -Treatment includes tylenol -Reevaluation of the patient after these medicines showed that the patient improved -PCP office notes or outside notes reviewed -Escalation to admission/observation considered: patients feels much better, is comfortable with discharge,  and will follow up with PCP -Prescription medication considered, patient comfortable with home medication -Social Determinant of Health considered         Final Clinical Impression(s) / ED Diagnoses Final diagnoses:  Atypical chest pain    Rx / DC Orders ED Discharge Orders     None         Domenic Moras, PA-C 08/18/22 2330    Elgie Congo, MD 08/19/22 [phone]

## 2022-08-18 NOTE — ED Notes (Signed)
Patient transported to X-ray 

## 2022-08-18 NOTE — Discharge Instructions (Addendum)
You have been evaluated for your symptoms.  Fortunately no concerning finding noted on today's exam.  Continue to take your medication recently prescribed for your recent dental extraction.  Return to the ER if you have any concern.

## 2022-08-18 NOTE — ED Notes (Signed)
Patient denies pain and is resting comfortably.  

## 2022-08-18 NOTE — ED Notes (Signed)
Pt. Reports she had her wisdom teeth all 4 extracted yesterday and today she started to have chest pain at August.  The chest pain was center chest and below the breast per the Pt.  Pt. States the pain lasted approx. 30-40 mins with no reports of vomiting or nausea and no sweating.  Pt. Reports no arm pain and no specific jaw pain related to the chest pain.  No shortness of breath per Pt.    At present time the Pt. Reports feeling fine and no chest pain per Pt.

## 2022-08-18 NOTE — ED Triage Notes (Signed)
Pt reports sharp central chest pain, onset tonight when she was at home watching tv. She reports she had her wisdom teeth removed yesterday.

## 2022-10-21 DIAGNOSIS — R109 Unspecified abdominal pain: Secondary | ICD-10-CM | POA: Diagnosis not present

## 2022-10-21 DIAGNOSIS — R195 Other fecal abnormalities: Secondary | ICD-10-CM | POA: Diagnosis not present

## 2022-10-21 DIAGNOSIS — Z202 Contact with and (suspected) exposure to infections with a predominantly sexual mode of transmission: Secondary | ICD-10-CM | POA: Diagnosis not present

## 2022-10-21 DIAGNOSIS — R110 Nausea: Secondary | ICD-10-CM | POA: Diagnosis not present

## 2022-10-21 DIAGNOSIS — R102 Pelvic and perineal pain: Secondary | ICD-10-CM | POA: Diagnosis not present

## 2022-10-21 DIAGNOSIS — N898 Other specified noninflammatory disorders of vagina: Secondary | ICD-10-CM | POA: Diagnosis not present

## 2022-11-11 DIAGNOSIS — S86911A Strain of unspecified muscle(s) and tendon(s) at lower leg level, right leg, initial encounter: Secondary | ICD-10-CM | POA: Diagnosis not present

## 2022-11-12 DIAGNOSIS — M25561 Pain in right knee: Secondary | ICD-10-CM | POA: Diagnosis not present

## 2022-11-12 DIAGNOSIS — M238X2 Other internal derangements of left knee: Secondary | ICD-10-CM | POA: Diagnosis not present

## 2023-04-04 DIAGNOSIS — Z23 Encounter for immunization: Secondary | ICD-10-CM | POA: Diagnosis not present

## 2023-04-04 DIAGNOSIS — Z30015 Encounter for initial prescription of vaginal ring hormonal contraceptive: Secondary | ICD-10-CM | POA: Diagnosis not present

## 2023-05-18 ENCOUNTER — Ambulatory Visit (INDEPENDENT_AMBULATORY_CARE_PROVIDER_SITE_OTHER): Payer: Medicaid Other

## 2023-05-18 ENCOUNTER — Ambulatory Visit
Admission: EM | Admit: 2023-05-18 | Discharge: 2023-05-18 | Disposition: A | Discharge status: Home / Self Care | Payer: Medicaid Other | Attending: Internal Medicine | Admitting: Internal Medicine

## 2023-05-18 DIAGNOSIS — R0789 Other chest pain: Secondary | ICD-10-CM | POA: Diagnosis not present

## 2023-05-18 DIAGNOSIS — R079 Chest pain, unspecified: Secondary | ICD-10-CM

## 2023-05-18 DIAGNOSIS — R509 Fever, unspecified: Secondary | ICD-10-CM

## 2023-05-18 LAB — POC COVID19/FLU A&B COMBO
Covid Antigen, POC: NEGATIVE
Influenza A Antigen, POC: NEGATIVE
Influenza B Antigen, POC: NEGATIVE

## 2023-05-18 MED ORDER — IBUPROFEN 600 MG PO TABS
600.0000 mg | ORAL_TABLET | Freq: Once | ORAL | Status: AC
  Administered 2023-05-18: 600 mg via ORAL

## 2023-05-18 NOTE — ED Provider Notes (Signed)
[facility]    CSN: [id number] Arrival date & time: 05/18/23  1300      History   Chief Complaint No chief complaint on file.   HPI Brittany Mccoy is a 27 y.o. female presents for chest pain.  Patient reports while at work about an hour ago she developed sudden onset of a midsternal chest pain that she describes as a tightness that has been persistent since that time and does not radiate.  Rates it as a current 6-7 out of 10.  Does state it hurts worse when she takes a deep breath.  She denies any shortness of breath, palpitations, dizziness, headache, syncope.  Denies asthma or smoking history.  Reports she did have a upper respiratory infection about a week ago that has nearly resolved.  Reports a negative COVID test at that time.  She still has some mild congestion.  Denies any fevers.  Denies any history of hypertension, hyperlipidemia, diabetes.  No family history of CAD.  Cannot identify any alleviating factors.  Does report history of anxiety and thinks it may be contributing to it.  She states she has had similar symptoms in the past that resolved on their own and she was not evaluated for that.  No OTC medications have been used since onset.  No other concerns at this time  HPI  Past Medical History:  Diagnosis Date   Hypoglycemia     There are no problems to display for this patient.   History reviewed. No pertinent surgical history.  OB History   No obstetric history on file.      Home Medications    Prior to Admission medications   Medication Sig Start Date End Date Taking? Authorizing Provider  acetaminophen (TYLENOL) 500 MG tablet Take 1,000 mg by mouth every 6 (six) hours as needed. For pain     [provider]  albuterol (PROVENTIL HFA;VENTOLIN HFA) 108 (90 Base) MCG/ACT inhaler Inhale 2 puffs into the lungs every 6 (six) hours as needed for wheezing or shortness of breath. 07/23/16   Palma Holter, MD  dexmethylphenidate (FOCALIN) 10  MG tablet Take 10 mg by mouth daily as needed. For ADHD    [provider]  fluticasone (FLONASE) 50 MCG/ACT nasal spray Place 2 sprays into both nostrils daily. 07/23/16   Palma Holter, MD  ibuprofen (ADVIL,MOTRIN) 600 MG tablet Take 1 tablet (600 mg total) by mouth every 6 (six) hours as needed. 08/08/13   Rancour, Jeannett Senior, MD  naproxen sodium (ANAPROX) 220 MG tablet Take 440 mg by mouth 2 (two) times daily with a meal.    [provider]  sulfamethoxazole-trimethoprim (SEPTRA DS) 800-160 MG per tablet Take 1 tablet by mouth every 12 (twelve) hours. 04/09/12   Elson Areas, PA-C    Family History History reviewed. No pertinent family history.  Social History Social History   Tobacco Use   Smoking status: Never   Smokeless tobacco: Never  Substance Use Topics   Drug use: No     Allergies   Penicillins   Review of Systems Review of Systems  HENT:  Positive for congestion.   Cardiovascular:  Positive for chest pain.     Physical Exam Triage Vital Signs ED Triage Vitals  Encounter Vitals Group     BP 05/18/23 1323 100/67     Systolic BP Percentile --      Diastolic BP Percentile --      Pulse Rate 05/18/23 1323 70  Resp 05/18/23 1323 17     Temp 05/18/23 1323 (!) 101.4 F (38.6 C)     Temp Source 05/18/23 1323 Oral     SpO2 05/18/23 1323 99 %     Weight --      Height --      Head Circumference --      Peak Flow --      Pain Score 05/18/23 1321 7     Pain Loc --      Pain Education --      Exclude from Growth Chart --    No data found.  Updated Vital Signs BP 100/67 (BP Location: Right Arm)   Pulse 70   Temp 99.6 F (37.6 C) (Oral)   Resp 17   LMP 05/16/2023 (Exact Date)   SpO2 99%   Visual Acuity Right Eye Distance:   Left Eye Distance:   Bilateral Distance:    Right Eye Near:   Left Eye Near:    Bilateral Near:     Physical Exam Vitals and nursing note reviewed.  Constitutional:      General: She is not in acute  distress.    Appearance: She is well-developed. She is not ill-appearing.  HENT:     Head: Normocephalic and atraumatic.     Right Ear: Tympanic membrane and ear canal normal.     Left Ear: Tympanic membrane and ear canal normal.     Nose: Congestion present.     Mouth/Throat:     Mouth: Mucous membranes are moist.     Pharynx: Oropharynx is clear. Uvula midline. No oropharyngeal exudate or posterior oropharyngeal erythema.     Tonsils: No tonsillar exudate or tonsillar abscesses.  Eyes:     Conjunctiva/sclera: Conjunctivae normal.     Pupils: Pupils are equal, round, and reactive to light.  Cardiovascular:     Rate and Rhythm: Normal rate and regular rhythm.     Heart sounds: Normal heart sounds.  Pulmonary:     Effort: Pulmonary effort is normal.     Breath sounds: Normal breath sounds.  Chest:     Chest wall: Tenderness present.  Musculoskeletal:     Cervical back: Normal range of motion and neck supple.  Lymphadenopathy:     Cervical: No cervical adenopathy.  Skin:    General: Skin is warm and dry.  Neurological:     General: No focal deficit present.     Mental Status: She is alert and oriented to person, place, and time.  Psychiatric:        Mood and Affect: Mood normal.        Behavior: Behavior normal.    PERC Criteria for low probability for Pulmonary Embolism  Age <50 years  Heart rate <100 beats/minute  Oxyhemoglobin saturation >=95 percent  No hemoptysis  No estrogen use  No prior DVT or PE  No unilateral leg swelling  No surgery/trauma requiring hospitalization within the prior four weeks  Total score: 0  Marburg score   Known clinical vascular disease  Age/sex (F >=65 years or M >=55 years)  Increased pain with exercise  Pain not reproducible by palpation  Patient assumes pain is of cardiac origin  Score ranges from 0 to 5 points. One point for each answer 0-2 points: negative result 3-5 points: positive result  Total score:  0  Interchest score  History of CAD  Age/sex (F >=65 years or M >=55 years)  Increased pain with exercise  Pain reproducible by palpation (-  1 if yes) Physician assumes cardiac origin  Pain feels like 'pressure'   Score ranges from ?1 to +5 points. One point for each answer except reproducible by palpation  <2 points: CAD negative 2-5 points: CAD positive   Total score: -1 for pain is reproducible      UC Treatments / Results  Labs (all labs ordered are listed, but only abnormal results are displayed) Labs Reviewed  POC COVID19/FLU A&B COMBO    EKG   Radiology No results found.  Procedures ED EKG  Date/Time: 05/18/2023 2:05 PM  Performed by: Radford Pax, NP Authorized by: Radford Pax, NP   ECG interpreted by ED Physician in the absence of a cardiologist: no   Previous ECG:    Previous ECG:  Compared to current   Comparison ECG info:  Improved Interpretation:    Interpretation: normal   Rate:    ECG rate:  66   ECG rate assessment: normal   Rhythm:    Rhythm: sinus rhythm   QRS:    QRS axis:  Normal ST segments:    ST segments:  Normal T waves:    T waves: normal   Q waves:    Abnormal Q-waves: not present    (including critical care time)  Medications Ordered in UC Medications  ibuprofen (ADVIL) tablet 600 mg (600 mg Oral Given 05/18/23 1411)    Initial Impression / Assessment and Plan / UC Course  I have reviewed the triage vital signs and the nursing notes.  Pertinent labs & imaging results that were available during my care of the patient were reviewed by me and considered in my medical decision making (see chart for details).     Reviewed exam and symptoms with patient.  Patient is febrile in clinic with a temp of 101.4.  Negative COVID and flu testing.  After ibuprofen fever came down to 99.6 and patient also reports improvement in chest pain.  EKG unremarkable and wet read of chest x-ray without consolidation.  Will contact for any  positive results with radiology overread.  PERC 0, Marburg 0 and interchest -1.  Discussed with patient multiple causes of chest pain including musculoskeletal, cardiac, pulmonary, etc.  Given her exam, workup, and improvement of her pain with ibuprofen discussed likely musculoskeletal in nature.  Advised to continue Tylenol or ibuprofen as needed and to monitor symptoms very closely.  If they do not continue to improve and/or they worsen, red flags reviewed, she is to go to the ER ASAP and patient verbalized understanding.  Patient follow-up with PCP in 2 days for recheck. Final Clinical Impressions(s) / UC Diagnoses   Final diagnoses:  Chest pain, unspecified type  Fever, unspecified  Chest pain, muscular     Discharge Instructions      Your workup done today is reassuring.  Please continue to monitor your symptoms and you may continue ibuprofen or Tylenol as needed.  Please go to the ER ASAP if you develop any new or worsening symptoms.  Please follow-up with your PCP in 2 days for recheck.  I hope you feel better soon!     ED Prescriptions   None    PDMP not reviewed this encounter.   Radford Pax, NP 05/18/23 [phone]

## 2023-05-18 NOTE — ED Triage Notes (Signed)
Pt presents with c/o chest tightness x 45 mins ago.    States she thinks it is coming from stress and anxiety. Pt states she feels she is gasping for air.   Denies other sxs.States she has been feeling depressed.

## 2023-05-18 NOTE — ED Triage Notes (Signed)
States she recently got over a cold. States she had a cough which has resolved.

## 2023-05-18 NOTE — Discharge Instructions (Signed)
Your workup done today is reassuring.  Please continue to monitor your symptoms and you may continue ibuprofen or Tylenol as needed.  Please go to the ER ASAP if you develop any new or worsening symptoms.  Please follow-up with your PCP in 2 days for recheck.  I hope you feel better soon!

## 2024-03-25 DIAGNOSIS — Z0000 Encounter for general adult medical examination without abnormal findings: Secondary | ICD-10-CM | POA: Diagnosis not present

## 2024-03-25 DIAGNOSIS — Z13220 Encounter for screening for lipoid disorders: Secondary | ICD-10-CM | POA: Diagnosis not present
